# Patient Record
Sex: Male | Born: 1965 | Race: White | Hispanic: No | Marital: Married | Smoking: Never smoker
Health system: Southern US, Community
[De-identification: ages and names within clinical notes are randomized; demographics above are authoritative.]

## PROBLEM LIST (undated history)

## (undated) DIAGNOSIS — M069 Rheumatoid arthritis, unspecified: Secondary | ICD-10-CM

## (undated) DIAGNOSIS — L4050 Arthropathic psoriasis, unspecified: Secondary | ICD-10-CM

## (undated) DIAGNOSIS — I639 Cerebral infarction, unspecified: Secondary | ICD-10-CM

## (undated) DIAGNOSIS — T7840XA Allergy, unspecified, initial encounter: Secondary | ICD-10-CM

## (undated) DIAGNOSIS — I10 Essential (primary) hypertension: Secondary | ICD-10-CM

## (undated) HISTORY — PX: FEMUR SURGERY: SHX943

## (undated) HISTORY — DX: Essential (primary) hypertension: I10

## (undated) HISTORY — PX: FRACTURE SURGERY: SHX138

## (undated) HISTORY — PX: LEG SURGERY: SHX1003

## (undated) HISTORY — PX: WRIST SURGERY: SHX841

## (undated) HISTORY — DX: Rheumatoid arthritis, unspecified: M06.9

## (undated) HISTORY — PX: IVC FILTER REMOVAL: CATH118246

## (undated) HISTORY — DX: Cerebral infarction, unspecified: I63.9

## (undated) HISTORY — DX: Allergy, unspecified, initial encounter: T78.40XA

---

## 2021-07-25 ENCOUNTER — Emergency Department: Payer: BC Managed Care – PPO

## 2021-07-25 ENCOUNTER — Emergency Department
Admission: EM | Admit: 2021-07-25 | Discharge: 2021-07-25 | Disposition: A | Discharge status: Home / Self Care | Payer: BC Managed Care – PPO | Attending: Emergency Medicine | Admitting: Emergency Medicine

## 2021-07-25 ENCOUNTER — Other Ambulatory Visit: Payer: Self-pay

## 2021-07-25 DIAGNOSIS — R059 Cough, unspecified: Secondary | ICD-10-CM | POA: Diagnosis present

## 2021-07-25 DIAGNOSIS — J209 Acute bronchitis, unspecified: Secondary | ICD-10-CM | POA: Diagnosis not present

## 2021-07-25 DIAGNOSIS — Z8616 Personal history of COVID-19: Secondary | ICD-10-CM | POA: Insufficient documentation

## 2021-07-25 DIAGNOSIS — J40 Bronchitis, not specified as acute or chronic: Secondary | ICD-10-CM

## 2021-07-25 LAB — TROPONIN I (HIGH SENSITIVITY): Troponin I (High Sensitivity): 3 ng/L (ref <18)

## 2021-07-25 LAB — CBC
HCT: 43.8 % (ref 39.0–52.0)
Hemoglobin: 14.9 g/dL (ref 13.0–17.0)
MCH: 31.1 pg (ref 26.0–34.0)
MCHC: 34 g/dL (ref 30.0–36.0)
MCV: 91.4 fL (ref 80.0–100.0)
Platelets: 163 10*3/uL (ref 150–400)
RBC: 4.79 MIL/uL (ref 4.22–5.81)
RDW: 11.9 % (ref 11.5–15.5)
WBC: 6.9 10*3/uL (ref 4.0–10.5)
nRBC: 0 % (ref 0.0–0.2)

## 2021-07-25 LAB — BASIC METABOLIC PANEL
Anion gap: 8 (ref 5–15)
BUN: 17 mg/dL (ref 6–20)
CO2: 27 mmol/L (ref 22–32)
Calcium: 9.2 mg/dL (ref 8.9–10.3)
Chloride: 100 mmol/L (ref 98–111)
Creatinine, Ser: 0.83 mg/dL (ref 0.61–1.24)
GFR, Estimated: >60 mL/min (ref >60)
Glucose, Bld: 101 mg/dL — ABNORMAL HIGH (ref 70–99)
Potassium: 3.9 mmol/L (ref 3.5–5.1)
Sodium: 135 mmol/L (ref 135–145)

## 2021-07-25 MED ORDER — IPRATROPIUM-ALBUTEROL 0.5-2.5 (3) MG/3ML IN SOLN
3.0000 mL | Freq: Once | RESPIRATORY_TRACT | Status: AC
  Administered 2021-07-25: 3 mL via RESPIRATORY_TRACT
  Filled 2021-07-25: qty 3

## 2021-07-25 MED ORDER — PREDNISONE 50 MG PO TABS: 50.0000 mg | ORAL_TABLET | Freq: Every day | ORAL | 0 refills | Status: DC
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
## 2021-07-25 NOTE — ED Triage Notes (Signed)
Pt states he had covid in September and since having a lingering cough , states in the past week the cough has worsened and having tightness in his chest., pt is in NAD on arrival, ambulatory with a steady gait.

## 2021-11-02 ENCOUNTER — Encounter: Payer: Self-pay | Admitting: Emergency Medicine

## 2021-11-02 ENCOUNTER — Emergency Department: Payer: BC Managed Care – PPO

## 2021-11-02 ENCOUNTER — Other Ambulatory Visit: Payer: Self-pay

## 2021-11-02 ENCOUNTER — Emergency Department
Admission: EM | Admit: 2021-11-02 | Discharge: 2021-11-02 | Disposition: A | Discharge status: Home / Self Care | Payer: BC Managed Care – PPO | Attending: Emergency Medicine | Admitting: Emergency Medicine

## 2021-11-02 DIAGNOSIS — L4050 Arthropathic psoriasis, unspecified: Secondary | ICD-10-CM

## 2021-11-02 DIAGNOSIS — L4052 Psoriatic arthritis mutilans: Secondary | ICD-10-CM | POA: Insufficient documentation

## 2021-11-02 DIAGNOSIS — Z8616 Personal history of COVID-19: Secondary | ICD-10-CM | POA: Diagnosis not present

## 2021-11-02 DIAGNOSIS — M25562 Pain in left knee: Secondary | ICD-10-CM | POA: Diagnosis present

## 2021-11-02 MED ORDER — HYDROCODONE-ACETAMINOPHEN 5-325 MG PO TABS
1.0000 | ORAL_TABLET | Freq: Once | ORAL | Status: AC
  Administered 2021-11-02: 1 via ORAL
  Filled 2021-11-02: qty 1

## 2021-11-02 MED ORDER — ETODOLAC 400 MG PO TABS: 400.0000 mg | ORAL_TABLET | Freq: Two times a day (BID) | ORAL | 1 refills | Status: DC

## 2021-11-02 MED ORDER — OXYCODONE-ACETAMINOPHEN 5-325 MG PO TABS: 1.0000 | ORAL_TABLET | Freq: Four times a day (QID) | ORAL | 0 refills | Status: DC | PRN

## 2021-11-02 MED ORDER — PREDNISONE 10 MG PO TABS: ORAL_TABLET | ORAL | 0 refills | Status: DC
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
## 2021-11-02 NOTE — ED Triage Notes (Signed)
Patient to ER via POV with complaints of bilateral knee pain that has been ongoing for several weeks. Right knee significantly worse today. Reports no injury. Reports he rides bicycle, states pain is actually relieved when riding.  ? ?Hx of psoriatic arthritis but has been without medication.  ?
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
## 2022-04-07 ENCOUNTER — Other Ambulatory Visit: Payer: Self-pay

## 2022-04-07 ENCOUNTER — Emergency Department: Payer: BC Managed Care – PPO

## 2022-04-07 ENCOUNTER — Emergency Department
Admission: EM | Admit: 2022-04-07 | Discharge: 2022-04-07 | Disposition: A | Discharge status: Home / Self Care | Payer: BC Managed Care – PPO | Attending: Emergency Medicine | Admitting: Emergency Medicine

## 2022-04-07 ENCOUNTER — Encounter: Payer: Self-pay | Admitting: Emergency Medicine

## 2022-04-07 DIAGNOSIS — R052 Subacute cough: Secondary | ICD-10-CM | POA: Diagnosis not present

## 2022-04-07 DIAGNOSIS — R059 Cough, unspecified: Secondary | ICD-10-CM | POA: Diagnosis present

## 2022-04-07 DIAGNOSIS — Z20822 Contact with and (suspected) exposure to covid-19: Secondary | ICD-10-CM | POA: Diagnosis not present

## 2022-04-07 LAB — GROUP A STREP BY PCR: Group A Strep by PCR: NOT DETECTED

## 2022-04-07 LAB — SARS CORONAVIRUS 2 BY RT PCR: SARS Coronavirus 2 by RT PCR: NEGATIVE

## 2022-04-07 MED ORDER — PREDNISONE 50 MG PO TABS: 50.0000 mg | ORAL_TABLET | Freq: Every day | ORAL | 0 refills | Status: DC

## 2022-04-07 MED ORDER — PREDNISONE 50 MG PO TABS
50.0000 mg | ORAL_TABLET | Freq: Every day | ORAL | 0 refills | Status: AC
Start: 2022-04-07 — End: 2022-04-12

## 2022-04-07 MED ORDER — BENZONATATE 100 MG PO CAPS: 100.0000 mg | ORAL_CAPSULE | Freq: Three times a day (TID) | ORAL | 0 refills | Status: DC | PRN

## 2022-04-07 MED ORDER — BENZONATATE 100 MG PO CAPS: 100.0000 mg | ORAL_CAPSULE | Freq: Three times a day (TID) | ORAL | 0 refills | Status: AC | PRN

## 2022-04-07 MED ORDER — ALBUTEROL SULFATE HFA 108 (90 BASE) MCG/ACT IN AERS: 2.0000 | INHALATION_SPRAY | Freq: Four times a day (QID) | RESPIRATORY_TRACT | 2 refills | Status: DC | PRN

## 2022-04-07 MED ORDER — ALBUTEROL SULFATE HFA 108 (90 BASE) MCG/ACT IN AERS
2.0000 | INHALATION_SPRAY | Freq: Four times a day (QID) | RESPIRATORY_TRACT | 2 refills | Status: AC | PRN
Start: 2022-04-07 — End: ?

## 2022-04-07 NOTE — ED Provider Notes (Signed)
[facility] Provider Note    None    (approximate)   History   Chief Complaint Cough   HPI Henry Reyes is a 56 y.o. male, history of rheumatoid arthritis, presents to the emergency department for evaluation of cough.  Patient states that he has had a persistent dry cough for 6 weeks, but reportedly has been developing a worsening sore throat for the past 2 to 3 days.  He believes it may be related to his [name] medication that he takes for psoriatic arthritis, for which he began taking approximately 6 months ago.  Denies any recent contact with sick people.  Denies fever/chills, body aches, chest pain, shortness of breath, abdominal pain, flank pain, nausea/vomiting, diarrhea, dysuria, headache, vision change, hearing change, rash/lesions, or dizziness/lightheadedness.   History Limitations: No limitations.        Physical Exam  Triage Vital Signs: ED Triage Vitals [04/07/22 0643]  Enc Vitals Group     BP (!) 133/97     Pulse Rate 84     Resp 18     Temp 98.9 F (37.2 C)     Temp Source Oral     SpO2 98 %     Weight 220 lb (99.8 kg)     Height 5\' 6"  (1.676 m)     Head Circumference      Peak Flow      Pain Score 0     Pain Loc      Pain Edu?      Excl. in GC?     Most recent vital signs: Vitals:   04/07/22 0643  BP: (!) 133/97  Pulse: 84  Resp: 18  Temp: 98.9 F (37.2 C)  SpO2: 98%    General: Awake, NAD.  Skin: Warm, dry. No rashes or lesions.  Eyes: PERRL. Conjunctivae normal.  CV: Good peripheral perfusion.  Resp: Normal effort.  Lung sounds are clear bilaterally in apices and bases. Abd: Soft, non-tender. No distention.  Neuro: At baseline. No gross neurological deficits.   Focused Exam: Mild erythema in the posterior pharynx, otherwise unremarkable throat exam.  No tonsillar swelling or exudates.  Physical Exam    ED Results / Procedures / Treatments  Labs (all labs ordered are listed, but only abnormal results are  displayed) Labs Reviewed  GROUP A STREP BY PCR  SARS CORONAVIRUS 2 BY RT PCR     EKG N/A.   RADIOLOGY  ED Provider Interpretation: I personally viewed and interpreted the chest x-ray, no evidence of pneumonia or active cardiopulmonary disease.  DG Chest 2 View  Result Date: 04/07/2022 CLINICAL DATA:  Cough EXAM: CHEST - 2 VIEW COMPARISON:  07/25/2021 FINDINGS: The heart size and mediastinal contours are within normal limits. Both lungs are clear. The visualized skeletal structures are unremarkable. Degenerative thoracic endplate spurring. IMPRESSION: No active cardiopulmonary disease. Electronically Signed   By: 07/27/2021 M.D.   On: 04/07/2022 07:04    PROCEDURES:  Critical Care performed: N/A.  Procedures    MEDICATIONS ORDERED IN ED: Medications - No data to display   IMPRESSION / MDM / ASSESSMENT AND PLAN / ED COURSE  I reviewed the triage vital signs and the nursing notes.                              Differential diagnosis includes, but is not limited to, viral URI, bronchitis, pneumonia, COVID-19, influenza, medication side effect  ED Course  Patient appears well, vitals within normal limits.  NAD.  Afebrile.  Strep PCR negative.  COVID-19 negative.  Assessment/Plan Patient presents with persistent cough x6 weeks associated with sore throat.  I suspect this is likely bronchitis versus allergic rhinitis/viral URI.  Strep PCR negative.  His sore throat is likely related to postnasal drip from sinus congestion.  Chest x-ray shows no evidence of pneumonia or active cardiopulmonary disease.  We will provide him with a short course of prednisone, benzonatate, and albuterol inhaler.  His symptoms could be related to the [place].  Encouraged him to follow-up with his rheumatologist if conservative management fails.  Additionally encouraged him to follow-up with his regular doctor as well.  We will plan to discharge.  Provided the patient with anticipatory guidance,  return precautions, and educational material. Encouraged the patient to return to the emergency department at any time if they begin to experience any new or worsening symptoms. Patient expressed understanding and agreed with the plan.   Patient's presentation is most consistent with acute complicated illness / injury requiring diagnostic workup.       FINAL CLINICAL IMPRESSION(S) / ED DIAGNOSES   Final diagnoses:  Subacute cough     Rx / DC Orders   ED Discharge Orders          Ordered    albuterol (VENTOLIN HFA) 108 (90 Base) MCG/ACT inhaler  Every 6 hours PRN,   Status:  Discontinued        04/07/22 0739    benzonatate (TESSALON PERLES) 100 MG capsule  3 times daily PRN,   Status:  Discontinued        04/07/22 0739    predniSONE (DELTASONE) 50 MG tablet  Daily with breakfast,   Status:  Discontinued        04/07/22 0739    albuterol (VENTOLIN HFA) 108 (90 Base) MCG/ACT inhaler  Every 6 hours PRN        04/07/22 0742    benzonatate (TESSALON PERLES) 100 MG capsule  3 times daily PRN        04/07/22 0742    predniSONE (DELTASONE) 50 MG tablet  Daily with breakfast        04/07/22 6387             Note:  This document was prepared using Dragon voice recognition software and may include unintentional dictation errors.   Varney Daily, [place] 04/07/22 5643    Concha Se, MD 04/07/22 [phone]

## 2022-04-07 NOTE — ED Triage Notes (Signed)
Pt presents via POV with complaints of a cough for the last 6 weeks. He notes developing a sore throat over the last 2-3 days. He notes being on "shot" that helps with his RA. Denies CP or SOB.

## 2022-04-07 NOTE — Discharge Instructions (Addendum)
-  You may take the albuterol as needed for chest tightness.  You may take the benzonatate as needed for cough.  -Take the prednisone daily for the next 5 days.  -Follow-up with your rheumatologist in regards to possible medication change.  -Follow-up with your pulmonologist if your symptoms fail to improve, as discussed.  -Return to the emergency department anytime if you begin to experience any new or worsening symptoms.

## 2022-04-07 NOTE — ED Notes (Signed)
41 yom with a c/c of dry cough since June. The pt advised he now is having cough, sore throat, and some ear pain for the last two days. The pt is warm, pink and dry. Alert and oriented x 4.

## 2022-05-29 ENCOUNTER — Other Ambulatory Visit: Payer: Self-pay

## 2022-05-29 ENCOUNTER — Emergency Department
Admission: EM | Admit: 2022-05-29 | Discharge: 2022-05-29 | Disposition: A | Discharge status: Home / Self Care | Payer: No Typology Code available for payment source | Attending: Emergency Medicine | Admitting: Emergency Medicine

## 2022-05-29 DIAGNOSIS — Y990 Civilian activity done for income or pay: Secondary | ICD-10-CM | POA: Diagnosis not present

## 2022-05-29 DIAGNOSIS — S01112A Laceration without foreign body of left eyelid and periocular area, initial encounter: Secondary | ICD-10-CM | POA: Insufficient documentation

## 2022-05-29 DIAGNOSIS — S0181XA Laceration without foreign body of other part of head, initial encounter: Secondary | ICD-10-CM

## 2022-05-29 DIAGNOSIS — Z23 Encounter for immunization: Secondary | ICD-10-CM | POA: Insufficient documentation

## 2022-05-29 DIAGNOSIS — W228XXA Striking against or struck by other objects, initial encounter: Secondary | ICD-10-CM | POA: Insufficient documentation

## 2022-05-29 DIAGNOSIS — S0592XA Unspecified injury of left eye and orbit, initial encounter: Secondary | ICD-10-CM | POA: Diagnosis present

## 2022-05-29 MED ORDER — TETANUS-DIPHTH-ACELL PERTUSSIS 5-2.5-18.5 LF-MCG/0.5 IM SUSY
0.5000 mL | PREFILLED_SYRINGE | Freq: Once | INTRAMUSCULAR | Status: AC
  Administered 2022-05-29: 0.5 mL via INTRAMUSCULAR
  Filled 2022-05-29: qty 0.5

## 2022-05-29 MED ORDER — LIDOCAINE HCL (PF) 1 % IJ SOLN
5.0000 mL | Freq: Once | INTRAMUSCULAR | Status: AC
  Administered 2022-05-29: 5 mL
  Filled 2022-05-29: qty 5
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
## 2022-05-29 NOTE — Discharge Instructions (Signed)
You have suffered a laceration to your left eyebrow.  This was repaired with absorbable sutures.  The sutures do not need to be removed.  As we discussed please do not rub this area, please keep it clean you may cover it with Neosporin.  Follow-up with your doctor for recheck/reevaluation.
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
## 2022-07-22 ENCOUNTER — Emergency Department
Admission: EM | Admit: 2022-07-22 | Discharge: 2022-07-22 | Disposition: A | Discharge status: Home / Self Care | Payer: BC Managed Care – PPO | Attending: Emergency Medicine | Admitting: Emergency Medicine

## 2022-07-22 ENCOUNTER — Other Ambulatory Visit: Payer: Self-pay

## 2022-07-22 ENCOUNTER — Emergency Department: Payer: BC Managed Care – PPO

## 2022-07-22 ENCOUNTER — Encounter: Payer: Self-pay | Admitting: Emergency Medicine

## 2022-07-22 DIAGNOSIS — S161XXA Strain of muscle, fascia and tendon at neck level, initial encounter: Secondary | ICD-10-CM | POA: Insufficient documentation

## 2022-07-22 DIAGNOSIS — Z8673 Personal history of transient ischemic attack (TIA), and cerebral infarction without residual deficits: Secondary | ICD-10-CM

## 2022-07-22 DIAGNOSIS — S060X0A Concussion without loss of consciousness, initial encounter: Secondary | ICD-10-CM | POA: Diagnosis not present

## 2022-07-22 DIAGNOSIS — Y92410 Unspecified street and highway as the place of occurrence of the external cause: Secondary | ICD-10-CM | POA: Diagnosis not present

## 2022-07-22 DIAGNOSIS — I6990 Unspecified sequelae of unspecified cerebrovascular disease: Secondary | ICD-10-CM | POA: Insufficient documentation

## 2022-07-22 DIAGNOSIS — S0990XA Unspecified injury of head, initial encounter: Secondary | ICD-10-CM | POA: Diagnosis present

## 2022-07-22 MED ORDER — METHOCARBAMOL 500 MG PO TABS: 500.0000 mg | ORAL_TABLET | Freq: Three times a day (TID) | ORAL | 0 refills | Status: AC | PRN

## 2022-07-22 MED ORDER — METHOCARBAMOL 500 MG PO TABS
500.0000 mg | ORAL_TABLET | Freq: Once | ORAL | Status: AC
  Administered 2022-07-22: 500 mg via ORAL
  Filled 2022-07-22: qty 1

## 2022-07-22 NOTE — Discharge Instructions (Signed)
Please take Tylenol 1000 mg every 6 hours as needed for pain and soreness.  You may take methocarbamol 3 times a day as needed for muscle spasms.  Work on gentle neck range of motion exercises.  Follow-up with primary care provider if no improvement in 1 week, a referral to physical therapy would be beneficial.  Please contact neurologist to discuss CT findings today suggesting a stroke has occurred sometime in the past.  Please start aspirin 81 mg daily.   Please avoid bright lights, loud noises, screen time while your headache persist.  Once your headache resolves you can slowly return to these normal activities but if headache returns you will need to discontinue these activities.
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
## 2022-11-14 ENCOUNTER — Encounter (INDEPENDENT_AMBULATORY_CARE_PROVIDER_SITE_OTHER): Payer: Self-pay | Admitting: Vascular Surgery

## 2022-11-14 ENCOUNTER — Ambulatory Visit (INDEPENDENT_AMBULATORY_CARE_PROVIDER_SITE_OTHER): Payer: BC Managed Care – PPO | Admitting: Vascular Surgery

## 2022-11-14 VITALS — BP 131/90 | HR 74 | Resp 16 | Wt 239.0 lb

## 2022-11-14 DIAGNOSIS — I639 Cerebral infarction, unspecified: Secondary | ICD-10-CM | POA: Diagnosis not present

## 2022-11-14 DIAGNOSIS — I10 Essential (primary) hypertension: Secondary | ICD-10-CM | POA: Diagnosis not present

## 2022-11-14 DIAGNOSIS — Z95828 Presence of other vascular implants and grafts: Secondary | ICD-10-CM | POA: Diagnosis not present

## 2022-11-14 NOTE — Patient Instructions (Signed)
Inferior Vena Cava Filter Removal  Inferior vena cava filter removal is a procedure to take out a metal filter that was placed into a large vein in the abdomen (inferior vena cava, or IVC). An IVC filter prevents blood clots in the legs or pelvis from traveling to the lungs. Some IVC filters are designed to be removed (retrievable filters). You may have your filter removed when the danger of forming blood clots has passed or when you can take blood-thinning medicine to prevent blood clots. In some cases, the filter may need to be removed because it becomes damaged, is not working, or is causing problems. Most filters can be removed through the vein (percutaneously). In the rare cases when the health care provider is unable to remove the filter percutaneously, one of these steps may be taken: A more invasive, open surgery. The filter may be left in place. Tell a health care provider about: Any allergies you have, including iodine. All medicines you are taking, including vitamins, herbs, eye drops, creams, and over-the-counter medicines. Any problems you or family members have had with anesthetic medicines or with contrast dyes that are used during imaging tests. Any bleeding problems you have. Any surgeries you have had. Any medical conditions you have. Whether you are pregnant or may be pregnant. What are the risks? Generally, this is a safe procedure. However, serious problems may occur, especially if the filter has been in for more than a few months. Possible problems include: Infection. Bleeding. Allergic reactions to medicines or dyes. Damage to the IVC, other blood vessels, or nearby structures. A blood clot or a piece of the filter breaking loose and traveling to the lungs. What happens before the procedure? When to stop eating and drinking Follow instructions from your health care provider about what you may eat and drink before your procedure. These may include: 8 hours before your  procedure Stop eating most foods. Do not eat meat, fried foods, or fatty foods. Eat only light foods, such as toast or crackers. All liquids are okay except energy drinks and alcohol. 6 hours before your procedure Stop eating. Drink only clear liquids, such as water, clear fruit juice, black coffee, plain tea, and sports drinks. Do not drink energy drinks or alcohol. 2 hours before your procedure Stop drinking all liquids. You may be allowed to take medicines with small sips of water. If you do not follow your health care provider's instructions, your procedure may be delayed or canceled. Medicines Ask your health care provider about: Changing or stopping your regular medicines. This is especially important if you are taking diabetes medicines or blood thinners. Taking medicines such as aspirin and ibuprofen. These medicines can thin your blood. Do not take these medicines unless your health care provider tells you to take them. Taking over-the-counter medicines, vitamins, herbs, and supplements. General instructions Do not use any products that contain nicotine or tobacco for at least 4 weeks before the procedure. These products include cigarettes, chewing tobacco, and vaping devices, such as e-cigarettes. If you need help quitting, ask your health care provider. Ask your health care provider: How your surgery site will be marked. What steps will be taken to help prevent infection. These steps may include: Removing hair at the surgery site. Washing skin with a germ-killing soap. Receiving antibiotic medicine. If you will be going home right after the procedure, plan to have a responsible adult: Take you home from the hospital or clinic. You will not be allowed to drive. Care for   you for the time you are told. What happens during the procedure? An IV will be inserted into one of your veins. You will be given one or more of the following: A medicine to help you relax (sedative). A  medicine to numb the area (local anesthetic). A medicine to make you fall asleep (general anesthetic). The procedure will be done through a vein in your groin or neck that leads to the IVC. A small incision will be made over the vein. A long, thin tube (catheter) will be inserted into the vein. The catheter will be moved through your vein and into your IVC. X-rays may be done to help guide the catheter into place. Dye may be injected through the catheter before the X-rays to make the catheter and filter easier to see. When the catheter reaches the filter, a hook (snare) on the end of the catheter may be used to latch on to the filter. In some cases, a grasping instrument (forceps) may be threaded through the catheter to gently grab and remove the filter instead. After the filter has been hooked or grasped, the filter and instruments will be pulled out through the catheter. The catheter will be removed through the insertion site in your skin. Pressure will be placed over your insertion site until bleeding stops. A bandage (dressing) will be placed over the catheter insertion site. The procedure may vary among health care providers and hospitals. What happens after the procedure? Your blood pressure, heart rate, breathing rate, and blood oxygen level will be monitored until you leave the hospital or clinic. You may need to stay in bed (be on bed rest) for a period of time. Your insertion site will be monitored for the first few hours for any signs of bleeding. If you were given a sedative during the procedure, it can affect you for several hours. Do not drive or operate machinery until your health care provider says that it is safe. Summary Inferior vena cava (IVC) filter removal is a procedure to take out a filter that was placed to prevent blood clots from traveling to your lungs. You may have your filter removed when the danger of forming blood clots has passed or when you can take  blood-thinning medicines to prevent blood clots. In some cases, a filter is removed because there is a problem with it. A long, thin tube (catheter) will be inserted through a vein in your neck or groin. Then, the filter will be gently grasped and pulled out through the catheter. Plan to have a responsible adult care for you for the time you are told. This information is not intended to replace advice given to you by your health care provider. Make sure you discuss any questions you have with your health care provider. Document Revised: 09/06/2021 Document Reviewed: 09/06/2021 Elsevier Patient Education  2023 [employer]  
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
## 2022-11-14 NOTE — Assessment & Plan Note (Signed)
I had a long discussion today with the patient regarding his IVC filter.  He has been in for over 15 years and at this point, if it has been endothelialized at the hook, it may not be possible to remove this.  A previous unsuccessful attempt does not preclude the ability to remove it, and I think it is reasonable to perform venogram and see if the filter can be retrieved as we have had some success of removing filters that have been on for many years.  I have discussed with him, that if we are not able to easily snare the hook and remove the filter, it would be left in place rather than potentially disrupting the filter or dislodging the filter and causing more harm than good.  We will be very reasonable in our attempts at retrieval.  It is hard to give him a percentage, but I think there is a reasonable chance that we can have success removing the filter.  He is very desirous to have this taken out so we will get him scheduled for a venogram and possible IVC filter removal in the near future at his convenience.
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
## 2022-11-14 NOTE — Assessment & Plan Note (Signed)
blood pressure control important in reducing the progression of atherosclerotic disease. On appropriate oral medications.  

## 2023-01-25 ENCOUNTER — Telehealth (INDEPENDENT_AMBULATORY_CARE_PROVIDER_SITE_OTHER): Payer: Self-pay

## 2023-01-25 NOTE — Telephone Encounter (Signed)
I attempted to contact the patient to schedule a IVC filter removal with Dr. Wyn Quaker. A message was left for a return call.

## 2023-01-30 NOTE — Telephone Encounter (Signed)
I Attempted to contact the patient to schedule him for a IVC filter removal with Dr. Wyn Quaker. A message was left for a return call.

## 2023-02-02 ENCOUNTER — Telehealth (INDEPENDENT_AMBULATORY_CARE_PROVIDER_SITE_OTHER): Payer: Self-pay

## 2023-02-02 NOTE — Telephone Encounter (Signed)
Spoke with the patient and he is scheduled with Dr. Wyn Quaker for a IVC filter removal on 02/19/23 with a 6:45 am arrival time to the [facility]. Pre-procedure instructions were discussed and will be sent to Mychart and mailed. Patient was offered 02/08/23, 02/12/23, and 02/15/23 and declined.

## 2023-02-19 ENCOUNTER — Encounter: Payer: Self-pay | Admitting: Vascular Surgery

## 2023-02-19 ENCOUNTER — Other Ambulatory Visit: Payer: Self-pay

## 2023-02-19 ENCOUNTER — Encounter
Admission: RE | Disposition: A | Discharge status: Home / Self Care | Payer: Self-pay | Source: Home / Self Care | Attending: Vascular Surgery

## 2023-02-19 ENCOUNTER — Ambulatory Visit
Admission: RE | Admit: 2023-02-19 | Discharge: 2023-02-19 | Disposition: A | Discharge status: Home / Self Care | Payer: BC Managed Care – PPO | Attending: Vascular Surgery | Admitting: Vascular Surgery

## 2023-02-19 DIAGNOSIS — Z8673 Personal history of transient ischemic attack (TIA), and cerebral infarction without residual deficits: Secondary | ICD-10-CM | POA: Insufficient documentation

## 2023-02-19 DIAGNOSIS — Z86718 Personal history of other venous thrombosis and embolism: Secondary | ICD-10-CM

## 2023-02-19 DIAGNOSIS — T82525A Displacement of umbrella device, initial encounter: Secondary | ICD-10-CM

## 2023-02-19 DIAGNOSIS — I82409 Acute embolism and thrombosis of unspecified deep veins of unspecified lower extremity: Secondary | ICD-10-CM | POA: Diagnosis not present

## 2023-02-19 DIAGNOSIS — I10 Essential (primary) hypertension: Secondary | ICD-10-CM | POA: Insufficient documentation

## 2023-02-19 DIAGNOSIS — Z4589 Encounter for adjustment and management of other implanted devices: Secondary | ICD-10-CM | POA: Insufficient documentation

## 2023-02-19 HISTORY — DX: Arthropathic psoriasis, unspecified: L40.50

## 2023-02-19 HISTORY — PX: IVC FILTER REMOVAL: CATH118246

## 2023-02-19 SURGERY — IVC FILTER REMOVAL
Anesthesia: Moderate Sedation

## 2023-02-19 MED ORDER — MIDAZOLAM HCL 2 MG/2ML IJ SOLN
INTRAMUSCULAR | Status: AC
  Filled 2023-02-19: qty 2

## 2023-02-19 MED ORDER — HYDROMORPHONE HCL 1 MG/ML IJ SOLN: 1.0000 mg | Freq: Once | INTRAMUSCULAR | Status: DC | PRN

## 2023-02-19 MED ORDER — FENTANYL CITRATE (PF) 100 MCG/2ML IJ SOLN
INTRAMUSCULAR | Status: AC
  Filled 2023-02-19: qty 2

## 2023-02-19 MED ORDER — MIDAZOLAM HCL 2 MG/2ML IJ SOLN
INTRAMUSCULAR | Status: DC | PRN
  Administered 2023-02-19: 2 mg via INTRAVENOUS

## 2023-02-19 MED ORDER — FAMOTIDINE 20 MG PO TABS: 40.0000 mg | ORAL_TABLET | Freq: Once | ORAL | Status: DC | PRN

## 2023-02-19 MED ORDER — FENTANYL CITRATE (PF) 100 MCG/2ML IJ SOLN
INTRAMUSCULAR | Status: DC | PRN
  Administered 2023-02-19: 50 ug via INTRAVENOUS

## 2023-02-19 MED ORDER — MIDAZOLAM HCL 2 MG/ML PO SYRP: 8.0000 mg | ORAL_SOLUTION | Freq: Once | ORAL | Status: DC | PRN

## 2023-02-19 MED ORDER — SODIUM CHLORIDE 0.9 % IV SOLN: INTRAVENOUS | Status: DC

## 2023-02-19 MED ORDER — METHYLPREDNISOLONE SODIUM SUCC 125 MG IJ SOLR: 125.0000 mg | Freq: Once | INTRAMUSCULAR | Status: DC | PRN

## 2023-02-19 MED ORDER — ONDANSETRON HCL 4 MG/2ML IJ SOLN: 4.0000 mg | Freq: Four times a day (QID) | INTRAMUSCULAR | Status: DC | PRN

## 2023-02-19 MED ORDER — DIPHENHYDRAMINE HCL 50 MG/ML IJ SOLN: 50.0000 mg | Freq: Once | INTRAMUSCULAR | Status: DC | PRN

## 2023-02-19 MED ORDER — HEPARIN (PORCINE) IN NACL 1000-0.9 UT/500ML-% IV SOLN
INTRAVENOUS | Status: DC | PRN
  Administered 2023-02-19: 500 mL

## 2023-02-19 MED ORDER — CEFAZOLIN SODIUM-DEXTROSE 2-4 GM/100ML-% IV SOLN
2.0000 g | INTRAVENOUS | Status: AC
  Administered 2023-02-19: 2 g via INTRAVENOUS

## 2023-02-19 MED ORDER — CEFAZOLIN SODIUM-DEXTROSE 2-4 GM/100ML-% IV SOLN
INTRAVENOUS | Status: AC
  Filled 2023-02-19: qty 100

## 2023-02-19 MED ORDER — HEPARIN SODIUM (PORCINE) 1000 UNIT/ML IJ SOLN
INTRAMUSCULAR | Status: AC
  Filled 2023-02-19: qty 10

## 2023-02-19 SURGICAL SUPPLY — 4 items
COVER PROBE ULTRASOUND 5X96 (MISCELLANEOUS) IMPLANT
KIT SNARE RETRIEVAL (KITS) IMPLANT
PACK ANGIOGRAPHY (CUSTOM PROCEDURE TRAY) ×1 IMPLANT
WIRE GUIDERIGHT .035X150 (WIRE) IMPLANT

## 2023-02-19 NOTE — H&P (Signed)
[facility] Admission History & Physical  MRN : [id number]  Henry Reyes is a 57 y.o. (10-May-1966) male who presents with chief complaint of No chief complaint on file. [name]  History of Present Illness: Patient presents today for attempted retrieval of his IVC filter.  No new complaints since office visit several months ago.  No current facility-administered medications for this encounter.    Past Medical History:  Diagnosis Date   Allergy    Hypertension    Rheumatoid arthritis (HCC)    Stroke [facility])     Past Surgical History:  Procedure Laterality Date   FEMUR SURGERY     LEG SURGERY     WRIST SURGERY Left      Social History   Tobacco Use   Smoking status: Never   Smokeless tobacco: Never  Substance Use Topics   Alcohol use: Yes   Drug use: Never     Family History  Problem Relation Age of Onset   Stroke Father     Allergies  Allergen Reactions   Penicillins    Penicillin G Rash      REVIEW OF SYSTEMS (Negative unless checked)   Constitutional: [] Weight loss  [] Fever  [] Chills Cardiac: [] Chest pain   [] Chest pressure   [] Palpitations   [] Shortness of breath when laying flat   [] Shortness of breath at rest   [] Shortness of breath with exertion. 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   [] Phlebitis   [x] Swelling in legs   [] Varicose veins   [] Non-healing ulcers Pulmonary:   [] Uses home oxygen   [] Productive cough   [] Hemoptysis   [] Wheeze  [] COPD   [] Asthma Neurologic:  [] Dizziness  [] Blackouts   [] Seizures   [] History of stroke   [] History of TIA  [] Aphasia   [] Temporary blindness   [] Dysphagia   [] Weakness or numbness in arms   [] Weakness or numbness in legs Musculoskeletal:  [x] Arthritis   [] Joint swelling   [] Joint pain   [] Low back pain Hematologic:   [] Easy bruising  [] Easy bleeding   [] Hypercoagulable state   [] Anemic  [] Hepatitis Gastrointestinal:  [] Blood in stool   [] Vomiting blood  [] Gastroesophageal reflux/heartburn   [] Abdominal pain Genitourinary:  [] Chronic kidney disease   [] Difficult urination  [] Frequent urination  [] Burning with urination   [] Hematuria Skin:  [] Rashes   [] Ulcers   [] Wounds Psychological:  [] History of anxiety   []  History of major depression.  Physical Examination  There were no vitals filed for this visit. There is no height or weight on file to calculate BMI. Gen: WD/WN, NAD Head: Craig/AT, No temporalis wasting. Ear/Nose/Throat: Hearing grossly intact, nares w/o erythema or drainage, oropharynx w/o Erythema/Exudate,  Eyes: Conjunctiva clear, sclera non-icteric Neck: Trachea midline.  No JVD.  Pulmonary:  Good air movement, respirations not labored, no use of accessory muscles.  Cardiac: RRR, normal S1, S2. Vascular:  Vessel Right Left  Radial Palpable Palpable               Musculoskeletal: M/S 5/5 throughout.  Extremities without ischemic changes.  No deformity or atrophy.  Neurologic: Sensation grossly intact in extremities.  Symmetrical.  Speech is fluent. Motor exam as listed above. Psychiatric: Judgment intact, Mood & affect appropriate for pt's clinical situation.  Dermatologic: No rashes or ulcers noted.  No cellulitis or open wounds.      CBC Lab Results  Component Value Date   WBC 6.9 07/25/2021   HGB 14.9 07/25/2021   HCT 43.8 07/25/2021   MCV 91.4 07/25/2021   PLT 163 07/25/2021    BMET    Component Value Date/Time   NA 135 07/25/2021 0736   K 3.9 07/25/2021 0736   CL 100 07/25/2021 0736   CO2 27 07/25/2021 0736   GLUCOSE 101 (H) 07/25/2021 0736   BUN 17 07/25/2021 0736   CREATININE 0.83 07/25/2021 0736   CALCIUM 9.2 07/25/2021 0736   GFRNONAA >60 07/25/2021 0736   CrCl cannot be calculated (Patient's most recent lab result is older than the maximum 21 days  allowed.).  COAG No results found for: "INR", "PROTIME"  Radiology No results found.   Assessment/Plan S/P IVC filter It has been in for over 15 years and at this point, if it has been endothelialized at the hook, it may not be possible to remove this.  A previous unsuccessful attempt does not preclude the ability to remove it, and I think it is reasonable to perform venogram and see if the filter can be retrieved as we have had some success of removing filters that have been on for many years.  I have discussed with him, that if we are not able to easily snare the hook and remove the filter, it would be left in place rather than potentially disrupting the filter or dislodging the filter and causing more harm than good.  We will be very reasonable in our attempts at retrieval.  It is hard to give him a percentage, but I think there is a reasonable chance that we can have success removing the filter.  He is very desirous to have this taken out so we will get him scheduled for a venogram and possible IVC filter removal in the near future at his convenience.   Stroke [facility]) Many years ago with his major trauma.  No residual symptoms.   Hypertension blood pressure control important in reducing the progression of atherosclerotic disease. On appropriate oral medications.   Festus Barren, MD  02/19/2023 7:02 AM

## 2023-02-19 NOTE — Op Note (Signed)
    OPERATIVE NOTE   PROCEDURE: Ultrasound guidance for vascular access to right jugular vein. Catheter placement into IVC from right jugular vein. Inferior venacavogram. Attempted retrieval of IVC filter, unsuccessful   PRE-OPERATIVE DIAGNOSIS: 1. Status post IVC filter for previous DVT    POST-OPERATIVE DIAGNOSIS: Same as above  SURGEON: Festus Barren, MD  ASSISTANT(S): None  ANESTHESIA: local with moderate conscious sedation for approximately 11 minutes using 2 mg of Versed and 50 mcg of Fentanyl  ESTIMATED BLOOD LOSS: 3 cc  FINDING(S): 1.  IVC filter with significant tilt and the hook and top of the IVC filter are clearly extraluminal and not in a location that would not be amenable to retrieval without high likelihood of fracture or disrupting the filter that would be more dangerous than the current situation.  SPECIMEN(S):  None  INDICATIONS:    Patient is a 57 y.o.male who presents with a previous history of IVC filter placement for a major trauma over 15 years ago.  The patient desires removal of the filter to avoid the small lifetime risks of perforation, infection, thrombosis, migration, and stent fracture.  Risks and benefits of removal were discussed and the patient is agreeable to proceed.  The patient understands that the filter may not be able to be retrieved if the top of the filter has embedded in the caval wall.   DESCRIPTION: After obtaining full informed written consent, the patient was brought back to the operating room and placed supine upon the operating table. Moderate conscious sedation was administered during a face to face encounter with the patient throughout the procedure with my supervision of the RN administering medicines and monitoring the patient's vital signs, pulse oximetry, telemetry and mental status throughout from the start of the procedure until the patient was taken to the recovery room.  After obtaining adequate sedation, the patient was prepped  and draped in the standard fashion. The right jugular vein was visualized with ultrasound and found to be widely patent. This was accessed under direct ultrasound guidance with a Seldinger needle and a permanent image was recorded. A J-wire was then placed. After skin nick and dilatation, the retrieval sheath was placed over the wire into the inferior vena cava. Inferior venacavogram was then performed. The IVC was found to be widely patent but the IVC filter was significantly tilted.  The hook and the top of the IVC filter are clearly extraluminal to the left.  A snare was brought onto the field and it was clear we were far from the top of the filter and there would be essentially no chance of retrieving the filter without high potential for disruption and damage to the filter which would be more dangerous than the current situation.  I elected to leave the IVC filter in place.  The sheath was then removed and pressure was held on the neck. The patient tolerated the procedure well and was taken to the recovery room in stable condition.  COMPLICATIONS: None  CONDITION: Stable    Festus Barren 02/19/2023 8:52 AM  This note was created with Dragon Medical transcription system. Any errors in dictation are purely unintentional.

## 2023-02-19 NOTE — Progress Notes (Signed)
Dr. Dew at bedside, speaking with pt. Re: procedure. Pt. Verbalized understanding of conversation.  

## 2023-02-20 ENCOUNTER — Encounter: Payer: Self-pay | Admitting: Vascular Surgery

## 2023-11-16 ENCOUNTER — Other Ambulatory Visit: Payer: Self-pay

## 2023-11-16 ENCOUNTER — Emergency Department
Admission: EM | Admit: 2023-11-16 | Discharge: 2023-11-16 | Disposition: A | Discharge status: Home / Self Care | Attending: Emergency Medicine | Admitting: Emergency Medicine

## 2023-11-16 DIAGNOSIS — Y9389 Activity, other specified: Secondary | ICD-10-CM | POA: Insufficient documentation

## 2023-11-16 DIAGNOSIS — X501XXA Overexertion from prolonged static or awkward postures, initial encounter: Secondary | ICD-10-CM | POA: Diagnosis not present

## 2023-11-16 DIAGNOSIS — M7989 Other specified soft tissue disorders: Secondary | ICD-10-CM | POA: Diagnosis present

## 2023-11-16 DIAGNOSIS — M7041 Prepatellar bursitis, right knee: Secondary | ICD-10-CM | POA: Diagnosis not present

## 2023-11-16 LAB — CBC WITH DIFFERENTIAL/PLATELET
Abs Immature Granulocytes: 0.04 10*3/uL (ref 0.00–0.07)
Basophils Absolute: 0 10*3/uL (ref 0.0–0.1)
Basophils Relative: 0 %
Eosinophils Absolute: 0.2 10*3/uL (ref 0.0–0.5)
Eosinophils Relative: 2 %
HCT: 41.8 % (ref 39.0–52.0)
Hemoglobin: 14.8 g/dL (ref 13.0–17.0)
Immature Granulocytes: 0 %
Lymphocytes Relative: 18 %
Lymphs Abs: 1.7 10*3/uL (ref 0.7–4.0)
MCH: 32 pg (ref 26.0–34.0)
MCHC: 35.4 g/dL (ref 30.0–36.0)
MCV: 90.3 fL (ref 80.0–100.0)
Monocytes Absolute: 0.7 10*3/uL (ref 0.1–1.0)
Monocytes Relative: 8 %
Neutro Abs: 7 10*3/uL (ref 1.7–7.7)
Neutrophils Relative %: 72 %
Platelets: 212 10*3/uL (ref 150–400)
RBC: 4.63 MIL/uL (ref 4.22–5.81)
RDW: 12.1 % (ref 11.5–15.5)
WBC: 9.6 10*3/uL (ref 4.0–10.5)
nRBC: 0 % (ref 0.0–0.2)

## 2023-11-16 LAB — COMPREHENSIVE METABOLIC PANEL
ALT: 28 U/L (ref 0–44)
AST: 24 U/L (ref 15–41)
Albumin: 3.7 g/dL (ref 3.5–5.0)
Alkaline Phosphatase: 46 U/L (ref 38–126)
Anion gap: 11 (ref 5–15)
BUN: 17 mg/dL (ref 6–20)
CO2: 24 mmol/L (ref 22–32)
Calcium: 9.1 mg/dL (ref 8.9–10.3)
Chloride: 103 mmol/L (ref 98–111)
Creatinine, Ser: 0.75 mg/dL (ref 0.61–1.24)
GFR, Estimated: >60 mL/min (ref >60)
Glucose, Bld: 147 mg/dL — ABNORMAL HIGH (ref 70–99)
Potassium: 3.6 mmol/L (ref 3.5–5.1)
Sodium: 138 mmol/L (ref 135–145)
Total Bilirubin: 0.6 mg/dL (ref 0.0–1.2)
Total Protein: 6.3 g/dL — ABNORMAL LOW (ref 6.5–8.1)

## 2023-11-16 LAB — SEDIMENTATION RATE: Sed Rate: 3 mm/h (ref 0–20)

## 2023-11-16 MED ORDER — CEPHALEXIN 500 MG PO CAPS: 500.0000 mg | ORAL_CAPSULE | Freq: Three times a day (TID) | ORAL | 0 refills | Status: AC

## 2023-11-16 NOTE — ED Triage Notes (Signed)
 Pt to ED via POV from home. 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 Pt reports last time this happen he was hospitalized for infection.

## 2023-11-16 NOTE — ED Notes (Signed)
 See triage notes. Patient c/o right knee and left pointer finger swelling. Patient receives autoimmune shots. Patient is concerned due to having a lengthy hospital stay the last time something similar occurred.

## 2023-11-16 NOTE — ED Provider Notes (Signed)
 [facility] Provider Note    Event Date/Time   First MD Initiated Contact with Patient 11/16/23 [phone]     (approximate)   History   Chief Complaint: Joint Swelling   HPI  Carney Saxton is a 58 y.o. male with a history of psoriatic arthritis who comes ED complaining of swelling of the right knee and left index finger, happened last night while at rest.  Denies injury.  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  Denies chest pain shortness of breath or fever.  Last saw rheumatology 2 days ago at which time he started a new mab injection medication.            Physical Exam   Triage Vital Signs: ED Triage Vitals  Encounter Vitals Group     BP 11/16/23 0750 110/89     Systolic BP Percentile --      Diastolic BP Percentile --      Pulse Rate 11/16/23 0748 81     Resp 11/16/23 0748 20     Temp 11/16/23 0748 98.6 F (37 C)     Temp Source 11/16/23 0748 Oral     SpO2 11/16/23 0748 99 %     Weight --      Height --      Head Circumference --      Peak Flow --      Pain Score 11/16/23 0749 6     Pain Loc --      Pain Education --      Exclude from Growth Chart --     Most recent vital signs: Vitals:   11/16/23 0748 11/16/23 0750  BP:  110/89  Pulse: 81   Resp: 20   Temp: 98.6 F (37 C)   SpO2: 99%     General: Awake, no distress.  CV:  Good peripheral perfusion.  Resp:  Normal effort.  Abd:  No distention.  Other:  Right knee with prepatellar swelling of the bursa.  Nontender, no erythema.  Intact range of motion of the knee joint.  No palpable knee effusion.  No bony tenderness.  Left index finger with some diffuse edema.  Not a sausage digit.  Negative Kanavel signs.  No wounds.   ED Results / Procedures / Treatments   Labs (all labs ordered are listed, but only abnormal results are displayed) Labs Reviewed  COMPREHENSIVE METABOLIC PANEL - Abnormal;  Notable for the following components:      Result Value   Glucose, Bld 147 (*)    Total Protein 6.3 (*)    All other components within normal limits  CBC WITH DIFFERENTIAL/PLATELET  SEDIMENTATION RATE     EKG    RADIOLOGY    PROCEDURES:  Procedures   MEDICATIONS ORDERED IN ED: Medications - No data to display   IMPRESSION / MDM / ASSESSMENT AND PLAN / ED COURSE  I reviewed the triage vital signs and the nursing notes.  DDx: Prepatellar bursitis, electrolyte derangement, AKI, thrombocytopenia  Patient's presentation is most consistent with acute presentation with potential threat to life or bodily function.  Patient presents with swelling of the left index finger and right knee, presentation is consistent with a prepatellar bursitis, possible induced by minor trauma related to his plumbing work.  Unclear if this is precipitated by or related to his recent Bimzelx mab injection.  No signs of infection currently, doubt septic arthritis, no signs  of cellulitis.  Doubt abscess.  Vital signs ESR white blood cell count are all normal.  He does report a history of infected bursitis with cellulitis and with the potential immunomodulators effect of the MAB, will start on Keflex prophylaxis.  Return precautions discussed.  Patient and spouse are comfortable monitoring for signs of infection.       FINAL CLINICAL IMPRESSION(S) / ED DIAGNOSES   Final diagnoses:  Prepatellar bursitis of right knee     Rx / DC Orders   ED Discharge Orders          Ordered    cephALEXin (KEFLEX) 500 MG capsule  3 times daily        11/16/23 9629             Note:  This document was prepared using Dragon voice recognition software and may include unintentional dictation errors.   Sharman Cheek, MD 11/16/23 [phone]

## 2024-01-22 ENCOUNTER — Encounter (INDEPENDENT_AMBULATORY_CARE_PROVIDER_SITE_OTHER): Payer: Self-pay

## 2024-02-03 IMAGING — DX DG KNEE COMPLETE 4+V*R*
4 series · 4 of 4 positions shown · non-contrast
Comparison: None.

CLINICAL DATA: Pain

EXAM:
RIGHT KNEE - COMPLETE 4+ VIEW

[knee ap]
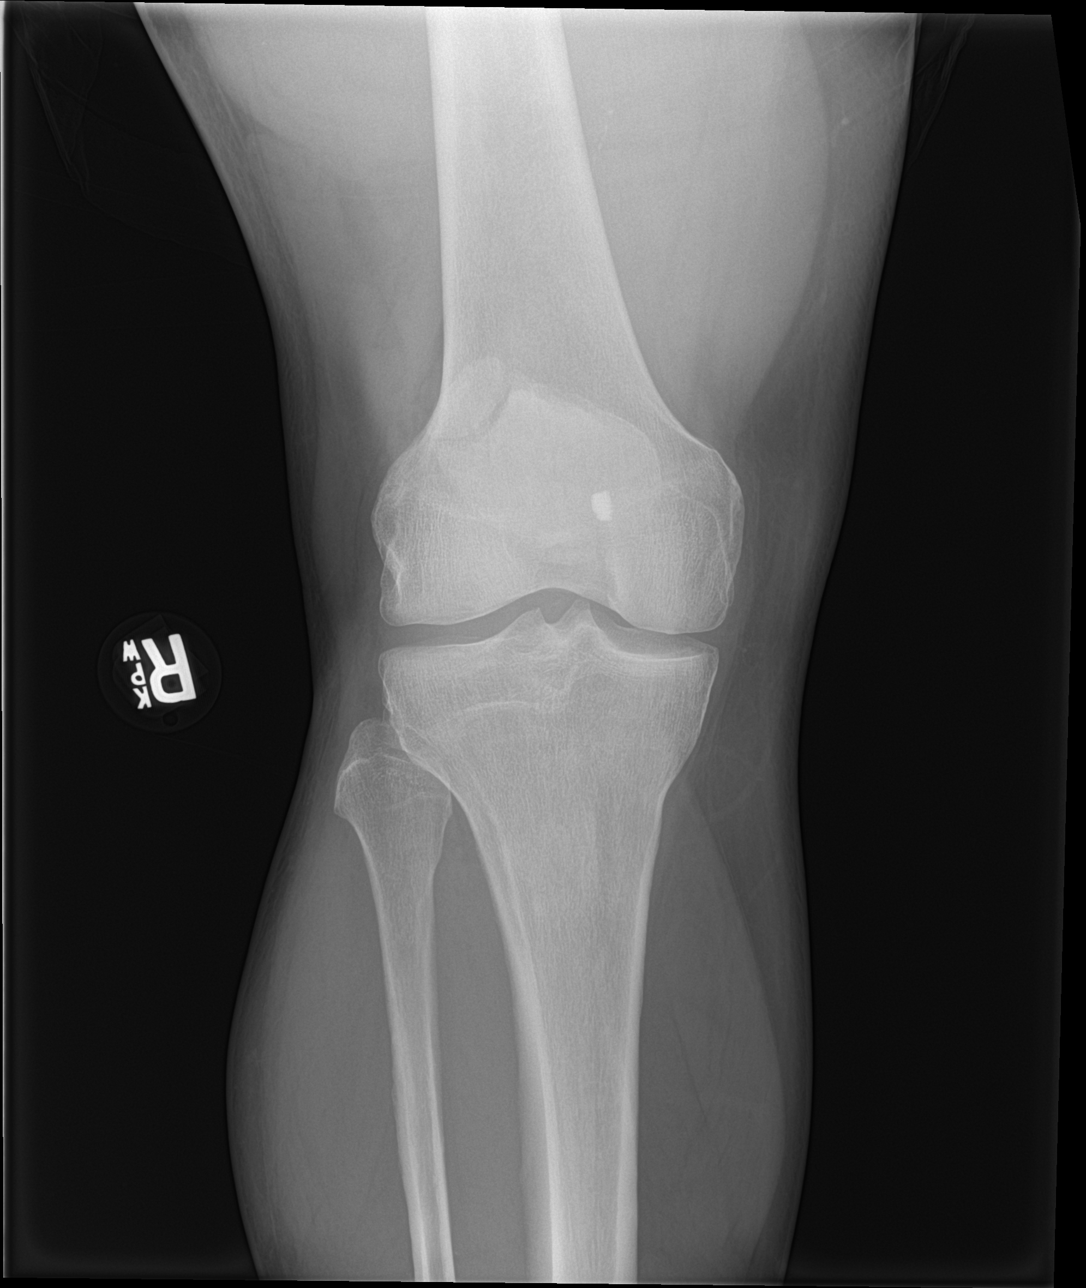

[knee lat]
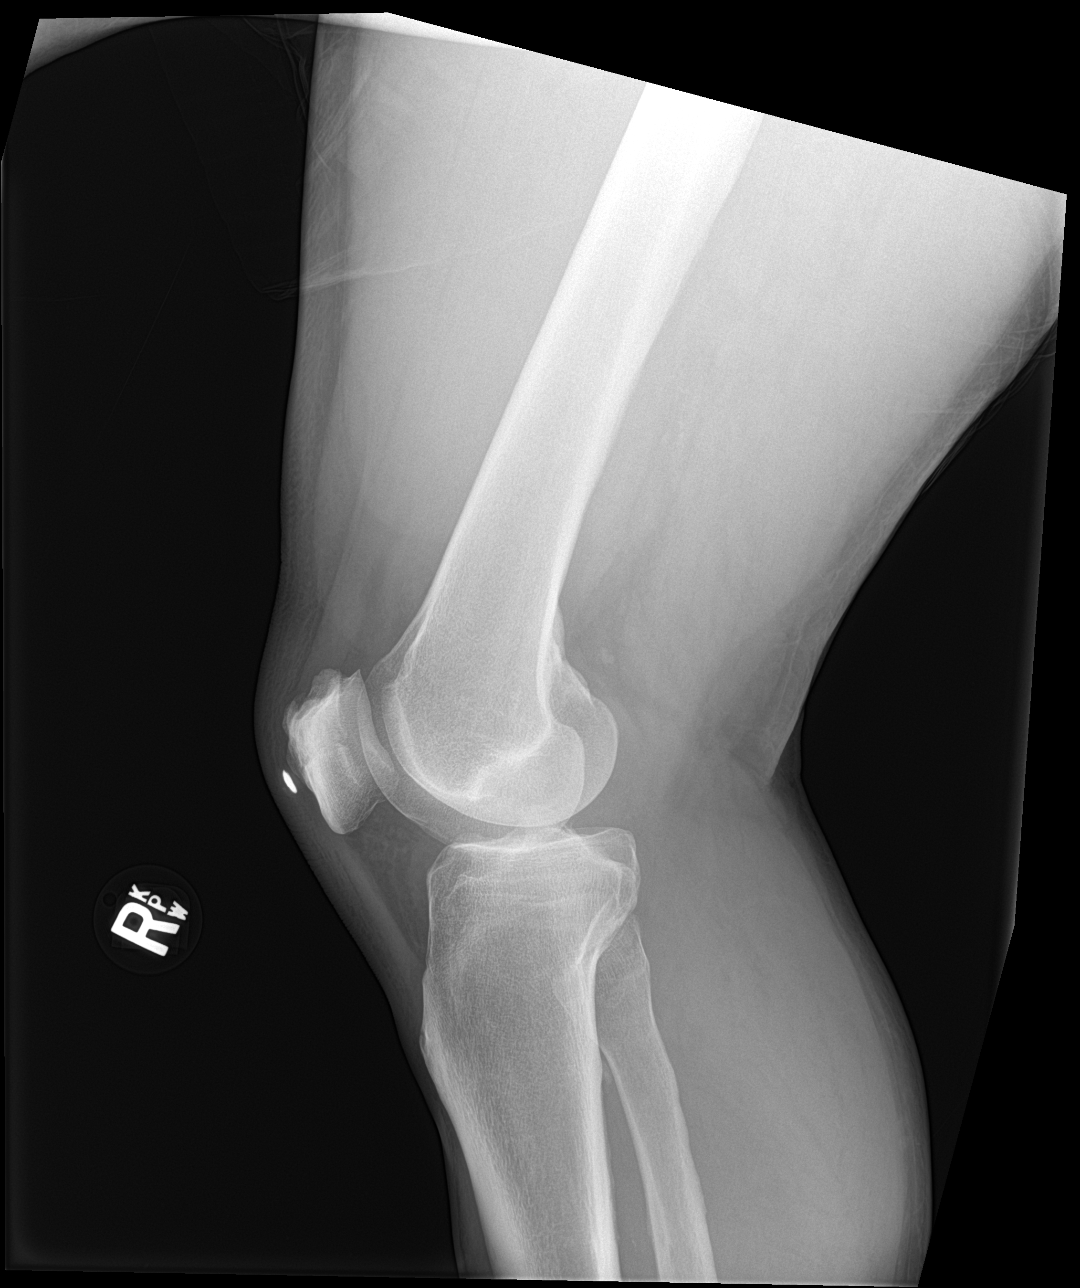

[knee obl (1 of 2)]
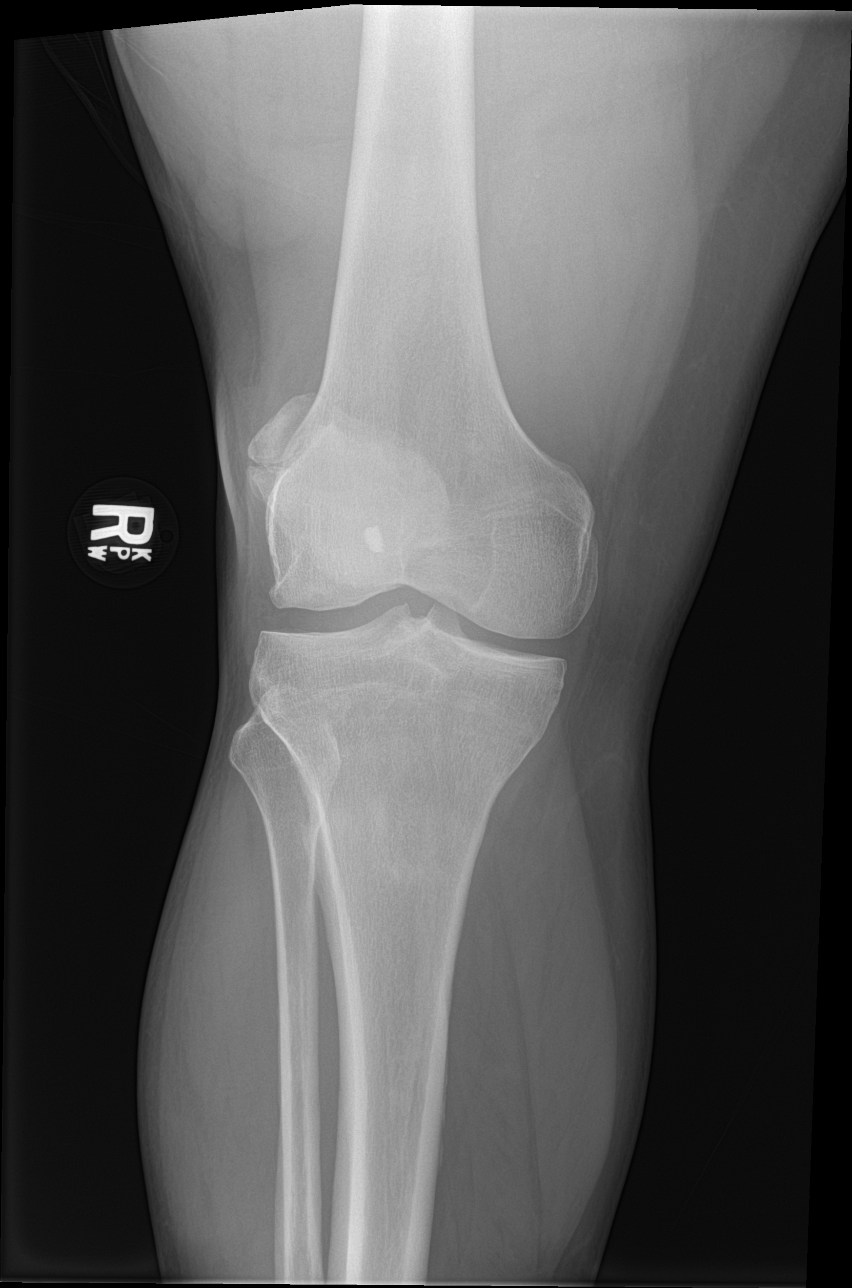

[knee obl (2 of 2)]
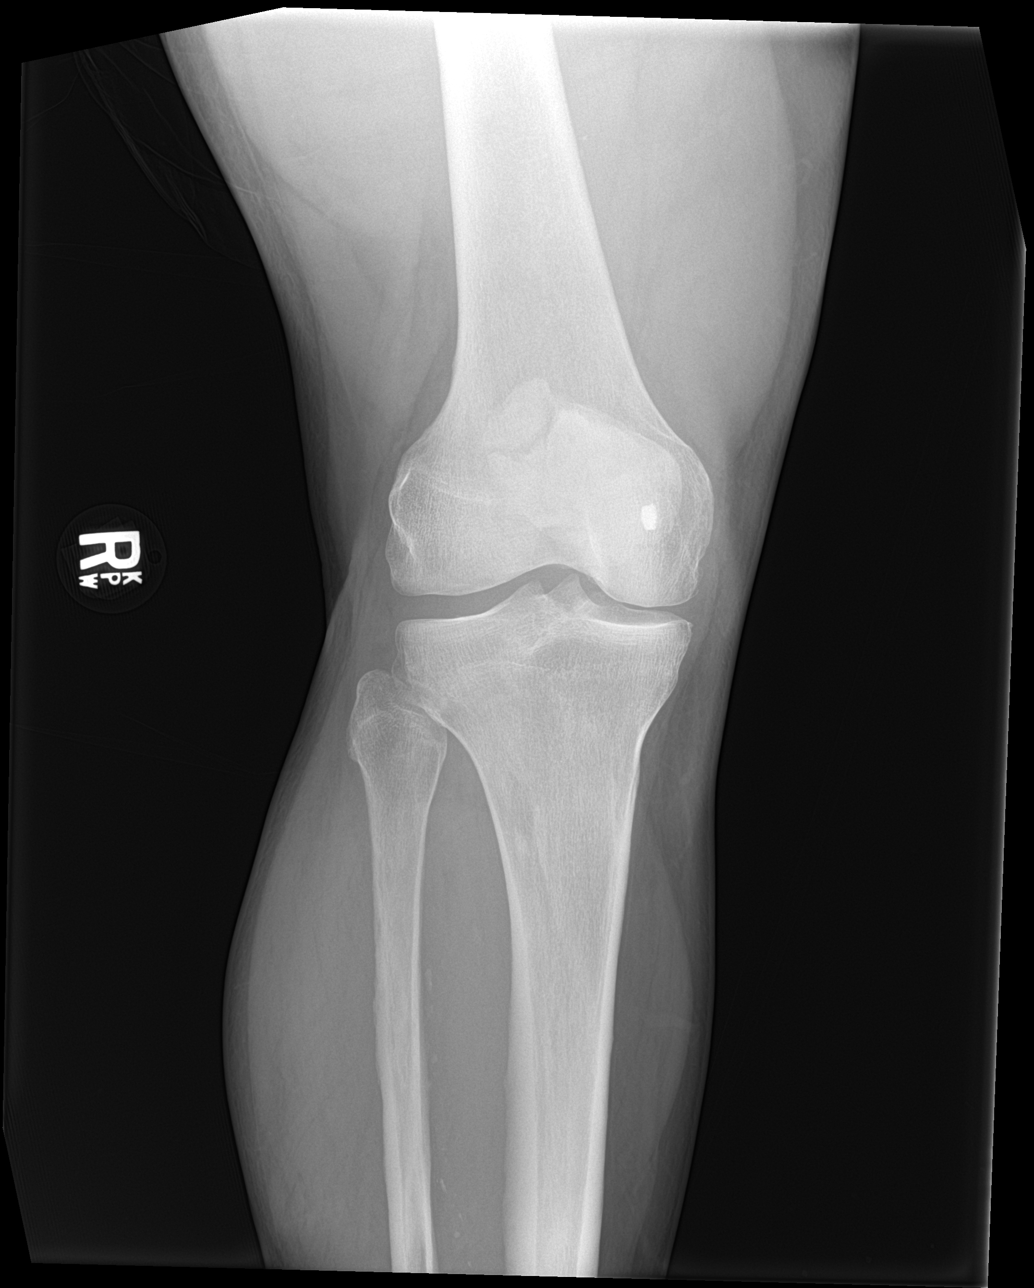

[4 of 4 positions shown; findings below may reference images not displayed]

FINDINGS: No joint effusion. There is no acute fracture. Cleft through the
superolateral patella likely reflects bipartite variant. Joint
spaces are preserved. There is a radiopaque foreign body ventral to
the patella.
IMPRESSION: No acute fracture.  Radiopaque foreign body ventral to the patella.

## 2024-07-21 ENCOUNTER — Encounter: Payer: Self-pay | Admitting: Gastroenterology

## 2024-07-21 ENCOUNTER — Encounter
Admission: RE | Disposition: A | Discharge status: Home / Self Care | Payer: Self-pay | Source: Home / Self Care | Attending: Gastroenterology

## 2024-07-21 ENCOUNTER — Ambulatory Visit
Admission: RE | Admit: 2024-07-21 | Discharge: 2024-07-21 | Disposition: A | Discharge status: Home / Self Care | Attending: Gastroenterology | Admitting: Gastroenterology

## 2024-07-21 ENCOUNTER — Other Ambulatory Visit: Payer: Self-pay | Admitting: Gastroenterology

## 2024-07-21 ENCOUNTER — Ambulatory Visit: Admitting: Anesthesiology

## 2024-07-21 DIAGNOSIS — K5730 Diverticulosis of large intestine without perforation or abscess without bleeding: Secondary | ICD-10-CM | POA: Insufficient documentation

## 2024-07-21 DIAGNOSIS — K640 First degree hemorrhoids: Secondary | ICD-10-CM | POA: Insufficient documentation

## 2024-07-21 DIAGNOSIS — D124 Benign neoplasm of descending colon: Secondary | ICD-10-CM | POA: Diagnosis not present

## 2024-07-21 DIAGNOSIS — Z1211 Encounter for screening for malignant neoplasm of colon: Secondary | ICD-10-CM | POA: Diagnosis present

## 2024-07-21 DIAGNOSIS — I10 Essential (primary) hypertension: Secondary | ICD-10-CM | POA: Insufficient documentation

## 2024-07-21 DIAGNOSIS — Z8673 Personal history of transient ischemic attack (TIA), and cerebral infarction without residual deficits: Secondary | ICD-10-CM | POA: Diagnosis not present

## 2024-07-21 HISTORY — PX: POLYPECTOMY: SHX149

## 2024-07-21 HISTORY — PX: COLONOSCOPY: SHX5424

## 2024-07-21 SURGERY — COLONOSCOPY
Anesthesia: General

## 2024-07-21 MED ORDER — LIDOCAINE HCL (PF) 2 % IJ SOLN
INTRAMUSCULAR | Status: AC
  Filled 2024-07-21: qty 5

## 2024-07-21 MED ORDER — SODIUM CHLORIDE 0.9 % IV SOLN
INTRAVENOUS | Status: DC
  Administered 2024-07-21: 500 mL via INTRAVENOUS

## 2024-07-21 MED ORDER — PROPOFOL 500 MG/50ML IV EMUL
INTRAVENOUS | Status: DC | PRN
Start: 2024-07-21 — End: 2024-07-21
  Administered 2024-07-21: 150 ug/kg/min via INTRAVENOUS

## 2024-07-21 MED ORDER — PROPOFOL 1000 MG/100ML IV EMUL
INTRAVENOUS | Status: AC
  Filled 2024-07-21: qty 100

## 2024-07-21 MED ORDER — LIDOCAINE HCL (CARDIAC) PF 100 MG/5ML IV SOSY
PREFILLED_SYRINGE | INTRAVENOUS | Status: DC | PRN
  Administered 2024-07-21: 100 mg via INTRAVENOUS

## 2024-07-21 MED ORDER — PROPOFOL 10 MG/ML IV BOLUS
INTRAVENOUS | Status: DC | PRN
Start: 2024-07-21 — End: 2024-07-21
  Administered 2024-07-21: 100 mg via INTRAVENOUS
  Administered 2024-07-21: 50 mg via INTRAVENOUS

## 2024-07-21 NOTE — Op Note (Signed)
 [facility] Gastroenterology Patient Name: Henry Reyes Procedure Date: 07/21/2024 10:12 AM MRN: [id number] Account #: [id number] Date of Birth: [birth year] Admit Type: Outpatient Age: 58 Room: [facility] ENDO ROOM 2 Gender: Male Note Status: Finalized Instrument Name: Colon Scope [id number] Procedure:             Colonoscopy Indications:           Screening for colorectal malignant neoplasm Providers:             Elspeth Ozell Jungling DO, DO Referring MD:          Tamra Leventhal, MD (Referring MD) Medicines:             Monitored Anesthesia Care Complications:         No immediate complications. Estimated blood loss:                         Minimal. Procedure:             Pre-Anesthesia Assessment:                        - Prior to the procedure, a History and Physical was                         performed, and patient medications and allergies were                         reviewed. The patient is competent. The risks and                         benefits of the procedure and the sedation options and                         risks were discussed with the patient. All questions                         were answered and informed consent was obtained.                         Patient identification and proposed procedure were                         verified by the physician, the nurse, the anesthetist                         and the technician in the endoscopy suite. Mental                         Status Examination: alert and oriented. Airway                         Examination: normal oropharyngeal airway and neck                         mobility. Respiratory Examination: clear to                         auscultation. CV Examination: RRR, no murmurs, no S3  or S4. Prophylactic Antibiotics: The patient does not                         require prophylactic antibiotics. Prior                         Anticoagulants: The patient has taken no  anticoagulant                         or antiplatelet agents. ASA Grade Assessment: III - A                         patient with severe systemic disease. After reviewing                         the risks and benefits, the patient was deemed in                         satisfactory condition to undergo the procedure. The                         anesthesia plan was to use monitored anesthesia care                         (MAC). Immediately prior to administration of                         medications, the patient was re-assessed for adequacy                         to receive sedatives. The heart rate, respiratory                         rate, oxygen saturations, blood pressure, adequacy of                         pulmonary ventilation, and response to care were                         monitored throughout the procedure. The physical                         status of the patient was re-assessed after the                         procedure.                        After obtaining informed consent, the colonoscope was                         passed under direct vision. Throughout the procedure,                         the patient's blood pressure, pulse, and oxygen                         saturations were monitored continuously. The  Colonoscope was introduced through the anus and                         advanced to the the cecum, identified by appendiceal                         orifice and ileocecal valve. The colonoscopy was                         performed without difficulty. The patient tolerated                         the procedure well. The quality of the bowel                         preparation was evaluated using the BBPS [facility] Bowel                         Preparation Scale) with scores of: Right Colon = 3                         (entire mucosa seen well with no residual staining,                         small fragments of stool or opaque liquid), Transverse                          Colon = 3 (entire mucosa seen well with no residual                         staining, small fragments of stool or opaque liquid)                         and Left Colon = 2 (minor amount of residual staining,                         small fragments of stool and/or opaque liquid, but                         mucosa seen well). The total BBPS score equals 8. The                         quality of the bowel preparation was excellent. The                         ileocecal valve, appendiceal orifice, and rectum were                         photographed. Findings:      The perianal and digital rectal examinations were normal. Pertinent       negatives include normal sphincter tone.      A 1 to 2 mm polyp was found in the descending colon. The polyp was       sessile. The polyp was removed with a jumbo cold forceps. Resection and       retrieval were complete. Estimated blood loss was minimal.      Multiple  small-mouthed diverticula were found in the ascending colon and       left colon. Estimated blood loss was minimal.      Non-bleeding internal hemorrhoids were found during retroflexion. The       hemorrhoids were Grade I (internal hemorrhoids that do not prolapse).       Estimated blood loss: none.      The exam was otherwise without abnormality on direct and retroflexion       views. Impression:            - One 1 to 2 mm polyp in the descending colon, removed                         with a jumbo cold forceps. Resected and retrieved.                        - Diverticulosis in the ascending colon and in the                         left colon.                        - Non-bleeding internal hemorrhoids.                        - The examination was otherwise normal on direct and                         retroflexion views. Recommendation:        - Patient has a contact number available for                         emergencies. The signs and symptoms of potential                          delayed complications were discussed with the patient.                         Return to normal activities tomorrow. Written                         discharge instructions were provided to the patient.                        - Discharge patient to home.                        - Resume previous diet.                        - Continue present medications.                        - Await pathology results.                        - Repeat colonoscopy for surveillance based on                         pathology results.                        -  Return to referring physician as previously                         scheduled.                        - The findings and recommendations were discussed with                         the patient. Procedure Code(s):     --- Professional ---                        [phone], Colonoscopy, flexible; with biopsy, single or                         multiple Diagnosis Code(s):     --- Professional ---                        Z12.11, Encounter for screening for malignant neoplasm                         of colon                        D12.4, Benign neoplasm of descending colon                        K64.0, First degree hemorrhoids                        K57.30, Diverticulosis of large intestine without                         perforation or abscess without bleeding CPT copyright 2022 American Medical Association. All rights reserved. The codes documented in this report are preliminary and upon coder review may  be revised to meet current compliance requirements. Attending Participation:      I personally performed the entire procedure. Elspeth Jungling, DO Elspeth Ozell Jungling DO, DO 07/21/2024 11:00:08 AM This report has been signed electronically. Number of Addenda: 0 Note Initiated On: 07/21/2024 10:12 AM Scope Withdrawal Time: 0 hours 9 minutes 42 seconds  Total Procedure Duration: 0 hours 15 minutes 24 seconds  Estimated Blood Loss:  Estimated  blood loss was minimal.      [facility]

## 2024-07-21 NOTE — Transfer of Care (Signed)
 Immediate Anesthesia Transfer of Care Note  Patient: Henry Reyes  Procedure(s) Performed: COLONOSCOPY POLYPECTOMY, INTESTINE  Patient Location: PACU  Anesthesia Type:General  Level of Consciousness: awake and sedated  Airway & Oxygen Therapy: Patient Spontanous Breathing and Patient connected to nasal cannula oxygen  Post-op Assessment: Report given to RN and Post -op Vital signs reviewed and stable  Post vital signs: Reviewed and stable  Last Vitals:  Vitals Value Taken Time  BP    Temp    Pulse    Resp    SpO2      Last Pain:  Vitals:   07/21/24 1007  TempSrc: Temporal  PainSc: 0-No pain         Complications: There were no known notable events for this encounter.

## 2024-07-21 NOTE — H&P (Signed)
 Pre-Procedure H&P   Patient ID: Henry Reyes is a 58 y.o. male.  Gastroenterology Provider: Elspeth Ozell Jungling, DO  Referring Provider: Dr. Sadie PCP: Sadie Manna, MD  Date: 07/21/2024  HPI Mr. Henry Reyes is a 58 y.o. male who presents today for Colonoscopy for Colorectal cancer screening.  Last csy 04/2017- Dr. Cherene- 1 polyp Paternal aunt- crc  On [place]el[place]    Past Medical History:  Diagnosis Date   Allergy    Hypertension    Psoriatic arthritis (HCC)    Rheumatoid arthritis (HCC)    Stroke [facility])     Past Surgical History:  Procedure Laterality Date   FEMUR SURGERY     FRACTURE SURGERY     IVC FILTER REMOVAL     IVC FILTER REMOVAL N/A 02/19/2023   Procedure: IVC FILTER REMOVAL;  Surgeon: Marea Selinda RAMAN, MD;  Location: [facility] INVASIVE CV LAB;  Service: Cardiovascular;  Laterality: N/A;   LEG SURGERY     WRIST SURGERY Left     Family History No h/o GI disease or malignancy  Review of Systems  Constitutional:  Negative for activity change, appetite change, chills, diaphoresis, fatigue, fever and unexpected weight change.  HENT:  Negative for trouble swallowing and voice change.   Respiratory:  Negative for shortness of breath and wheezing.   Cardiovascular:  Negative for chest pain, palpitations and leg swelling.  Gastrointestinal:  Negative for abdominal distention, abdominal pain, anal bleeding, blood in stool, constipation, diarrhea, nausea and vomiting.  Musculoskeletal:  Negative for arthralgias and myalgias.  Skin:  Negative for color change and pallor.  Neurological:  Negative for dizziness, syncope and weakness.  Psychiatric/Behavioral:  Negative for confusion. The patient is not nervous/anxious.   All other systems reviewed and are negative.    Medications No current facility-administered medications on file prior to encounter.   Current Outpatient Medications on File Prior to Encounter  Medication Sig Dispense Refill   aspirin EC 81 MG  tablet Take 81 mg by mouth daily.     losartan-hydrochlorothiazide (HYZAAR) 100-12.5 MG tablet Take 1 tablet by mouth daily.     Multiple Vitamins-Minerals (CENTRUM SILVER PO) Take 1 tablet by mouth daily.     albuterol  (VENTOLIN  HFA) 108 (90 Base) MCG/ACT inhaler Inhale 2 puffs into the lungs every 6 (six) hours as needed for wheezing or shortness of breath. (Patient not taking: Reported on 02/19/2023) 8 g 2   cephALEXin  (KEFLEX ) 500 MG capsule Take 1 capsule (500 mg total) by mouth 3 (three) times daily. (Patient not taking: Reported on 07/21/2024) 21 capsule 0   fluconazole (DIFLUCAN) 150 MG tablet Take 150 mg by mouth once a week. (Patient not taking: Reported on 02/19/2023)     fluticasone (FLONASE) 50 MCG/ACT nasal spray Place 1 spray into both nostrils 2 (two) times daily. (Patient not taking: Reported on 07/21/2024)     methocarbamol  (ROBAXIN ) 500 MG tablet Take 1 tablet (500 mg total) by mouth every 8 (eight) hours as needed for muscle spasms. (Patient not taking: Reported on 02/19/2023) 30 tablet 0   Risankizumab-rzaa (SKYRIZI PEN [place]) Inject 125 mg into the skin every 3 (three) months. (Patient not taking: Reported on 07/21/2024)      Pertinent medications related to GI and procedure were reviewed by me with the patient prior to the procedure   Current Facility-Administered Medications:    0.9 %  sodium chloride  infusion, , Intravenous, Continuous, Jungling Elspeth Ozell, DO, Last Rate: 20 mL/hr at 07/21/24 1017, 500 mL at 07/21/24  1017  sodium chloride  500 mL (07/21/24 1017)       Allergies  Allergen Reactions   Penicillins    Penicillin G Rash   Allergies were reviewed by me prior to the procedure  Objective   Body mass index is 40.51 kg/m. Vitals:   07/21/24 1007  BP: (!) 147/90  Pulse: 65  Resp: 18  Temp: (!) 96.1 F (35.6 C)  TempSrc: Temporal  SpO2: 95%  Weight: 113.9 kg  Height: 5' 6 (1.676 m)     Physical Exam Vitals and nursing note reviewed.   Constitutional:      General: He is not in acute distress.    Appearance: Normal appearance. He is obese. He is not ill-appearing, toxic-appearing or diaphoretic.  HENT:     Head: Normocephalic and atraumatic.     Nose: Nose normal.     Mouth/Throat:     Mouth: Mucous membranes are moist.     Pharynx: Oropharynx is clear.  Eyes:     General: No scleral icterus.    Extraocular Movements: Extraocular movements intact.  Cardiovascular:     Rate and Rhythm: Normal rate and regular rhythm.     Heart sounds: Normal heart sounds. No murmur heard.    No friction rub. No gallop.  Pulmonary:     Effort: Pulmonary effort is normal. No respiratory distress.     Breath sounds: Normal breath sounds. No wheezing, rhonchi or rales.  Abdominal:     General: Bowel sounds are normal. There is no distension.     Palpations: Abdomen is soft.     Tenderness: There is no abdominal tenderness. There is no guarding or rebound.  Musculoskeletal:     Cervical back: Neck supple.     Right lower leg: No edema.     Left lower leg: No edema.  Skin:    General: Skin is warm and dry.     Coloration: Skin is not jaundiced or pale.  Neurological:     General: No focal deficit present.     Mental Status: He is alert and oriented to person, place, and time. Mental status is at baseline.  Psychiatric:        Mood and Affect: Mood normal.        Behavior: Behavior normal.        Thought Content: Thought content normal.        Judgment: Judgment normal.      Assessment:  Mr. Henry Reyes is a 58 y.o. male  who presents today for Colonoscopy for Colorectal cancer screening .  Plan:  Colonoscopy with possible intervention today  Colonoscopy with possible biopsy, control of bleeding, polypectomy, and interventions as necessary has been discussed with the patient/patient representative. Informed consent was obtained from the patient/patient representative after explaining the indication, nature, and risks of  the procedure including but not limited to death, bleeding, perforation, missed neoplasm/lesions, cardiorespiratory compromise, and reaction to medications. Opportunity for questions was given and appropriate answers were provided. Patient/patient representative has verbalized understanding is amenable to undergoing the procedure.   Elspeth Ozell Jungling, DO  [facility]  Portions of the record may have been created with voice recognition software. Occasional wrong-word or 'sound-a-like' substitutions may have occurred due to the inherent limitations of voice recognition software.  Read the chart carefully and recognize, using context, where substitutions may have occurred.

## 2024-07-21 NOTE — Anesthesia Preprocedure Evaluation (Addendum)
 Anesthesia Evaluation  Patient identified by MRN, date of birth, ID band Patient awake    Reviewed: Allergy & Precautions, NPO status , Patient's Chart, lab work & pertinent test results  History of Anesthesia Complications Negative for: history of anesthetic complications  Airway Mallampati: III  TM Distance: >3 FB Neck ROM: full    Dental no notable dental hx.    Pulmonary neg pulmonary ROS   Pulmonary exam normal        Cardiovascular hypertension, On Medications Normal cardiovascular exam     Neuro/Psych CVA  negative psych ROS   GI/Hepatic negative GI ROS, Neg liver ROS,,,  Endo/Other  negative endocrine ROS    Renal/GU negative Renal ROS  negative genitourinary   Musculoskeletal  (+) Arthritis ,    Abdominal   Peds  Hematology negative hematology ROS (+)   Anesthesia Other Findings Past Medical History: No date: Allergy No date: Hypertension No date: Psoriatic arthritis (HCC) No date: Rheumatoid arthritis (HCC) No date: Stroke [facility])  Past Surgical History: No date: FEMUR SURGERY No date: IVC FILTER REMOVAL 02/19/2023: IVC FILTER REMOVAL; N/A     Comment:  Procedure: IVC FILTER REMOVAL;  Surgeon: Marea Selinda RAMAN,               MD;  Location: [facility] INVASIVE CV LAB;  Service:               Cardiovascular;  Laterality: N/A; No date: LEG SURGERY No date: WRIST SURGERY; Left     Reproductive/Obstetrics negative OB ROS                              Anesthesia Physical Anesthesia Plan  ASA: 3  Anesthesia Plan: General   Post-op Pain Management: Minimal or no pain anticipated   Induction: Intravenous  PONV Risk Score and Plan: 1 and Propofol infusion and TIVA  Airway Management Planned: Natural Airway and Nasal Cannula  Additional Equipment:   Intra-op Plan:   Post-operative Plan:   Informed Consent: I have reviewed the patients History and Physical, chart, labs and  discussed the procedure including the risks, benefits and alternatives for the proposed anesthesia with the patient or authorized representative who has indicated his/her understanding and acceptance.     Dental Advisory Given  Plan Discussed with: Anesthesiologist, CRNA and Surgeon  Anesthesia Plan Comments: (Patient consented for risks of anesthesia including but not limited to:  - adverse reactions to medications - risk of airway placement if required - damage to eyes, teeth, lips or other oral mucosa - nerve damage due to positioning  - sore throat or hoarseness - Damage to heart, brain, nerves, lungs, other parts of body or loss of life  Patient voiced understanding and assent.)         Anesthesia Quick Evaluation

## 2024-07-21 NOTE — Interval H&P Note (Signed)
 History and Physical Interval Note: Preprocedure H&P from 07/21/24  was reviewed and there was no interval change after seeing and examining the patient.  Written consent was obtained from the patient after discussion of risks, benefits, and alternatives. Patient has consented to proceed with Colonoscopy with possible intervention   07/21/2024 10:25 AM  Henry Reyes  has presented today for surgery, with the diagnosis of Screening for colon cancer (Z12.11).  The various methods of treatment have been discussed with the patient and family. After consideration of risks, benefits and other options for treatment, the patient has consented to  Procedure(s): COLONOSCOPY (N/A) as a surgical intervention.  The patient's history has been reviewed, patient examined, no change in status, stable for surgery.  I have reviewed the patient's chart and labs.  Questions were answered to the patient's satisfaction.     Elspeth Ozell Jungling

## 2024-07-22 LAB — SURGICAL PATHOLOGY

## 2024-07-22 NOTE — Anesthesia Postprocedure Evaluation (Signed)
 Anesthesia Post Note  Patient: Henry Reyes  Procedure(s) Performed: COLONOSCOPY POLYPECTOMY, INTESTINE  Patient location during evaluation: Endoscopy Anesthesia Type: General Level of consciousness: awake and alert Pain management: pain level controlled Vital Signs Assessment: post-procedure vital signs reviewed and stable Respiratory status: spontaneous breathing, nonlabored ventilation, respiratory function stable and patient connected to nasal cannula oxygen Cardiovascular status: blood pressure returned to baseline and stable Postop Assessment: no apparent nausea or vomiting Anesthetic complications: no   There were no known notable events for this encounter.   Last Vitals:  Vitals:   07/21/24 1119 07/21/24 1128  BP: (!) 139/100 (!) 143/97  Pulse: 72 68  Resp: 17 14  Temp:    SpO2: 99% 100%    Last Pain:  Vitals:   07/21/24 1007  TempSrc: Temporal  PainSc: 0-No pain                 [name]
